# Patient Record
Sex: Male | Born: 2005 | Race: Black or African American | Hispanic: No | Marital: Single | Smoking: Never smoker
Health system: Southern US, Community
[De-identification: ages and names within clinical notes are randomized; demographics above are authoritative.]

## PROBLEM LIST (undated history)

## (undated) DIAGNOSIS — T7840XA Allergy, unspecified, initial encounter: Secondary | ICD-10-CM

## (undated) HISTORY — PX: TYMPANOSTOMY TUBE PLACEMENT: SHX32

## (undated) HISTORY — PX: TONSILLECTOMY: SUR1361

## (undated) HISTORY — DX: Allergy, unspecified, initial encounter: T78.40XA

---
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
## 2025-03-08 ENCOUNTER — Ambulatory Visit
